# Patient Record
Sex: Female | Born: 1970 | Race: White | Hispanic: No | Marital: Married | Smoking: Current every day smoker
Health system: Southern US, Community
[De-identification: ages and names within clinical notes are randomized; demographics above are authoritative.]

## PROBLEM LIST (undated history)

## (undated) HISTORY — PX: TUBAL LIGATION: SHX77

---

## 1998-03-22 ENCOUNTER — Other Ambulatory Visit: Admission: RE | Admit: 1998-03-22 | Discharge: 1998-03-22 | Payer: Self-pay | Admitting: Obstetrics and Gynecology

## 1999-03-23 ENCOUNTER — Other Ambulatory Visit: Admission: RE | Admit: 1999-03-23 | Discharge: 1999-03-23 | Payer: Self-pay | Admitting: Obstetrics and Gynecology

## 2000-03-15 ENCOUNTER — Other Ambulatory Visit: Admission: RE | Admit: 2000-03-15 | Discharge: 2000-03-15 | Payer: Self-pay | Admitting: Obstetrics and Gynecology

## 2001-03-21 ENCOUNTER — Other Ambulatory Visit: Admission: RE | Admit: 2001-03-21 | Discharge: 2001-03-21 | Payer: Self-pay | Admitting: *Deleted

## 2002-08-04 ENCOUNTER — Other Ambulatory Visit: Admission: RE | Admit: 2002-08-04 | Discharge: 2002-08-04 | Payer: Self-pay | Admitting: Obstetrics and Gynecology

## 2003-01-28 ENCOUNTER — Inpatient Hospital Stay (HOSPITAL_COMMUNITY): Admission: AD | Admit: 2003-01-28 | Discharge: 2003-01-28 | Payer: Self-pay | Admitting: Obstetrics and Gynecology

## 2003-01-31 ENCOUNTER — Encounter (INDEPENDENT_AMBULATORY_CARE_PROVIDER_SITE_OTHER): Payer: Self-pay | Admitting: Specialist

## 2003-01-31 ENCOUNTER — Inpatient Hospital Stay (HOSPITAL_COMMUNITY): Admission: AD | Admit: 2003-01-31 | Discharge: 2003-02-03 | Payer: Self-pay | Admitting: Obstetrics and Gynecology

## 2003-03-16 ENCOUNTER — Other Ambulatory Visit: Admission: RE | Admit: 2003-03-16 | Discharge: 2003-03-16 | Payer: Self-pay | Admitting: Obstetrics and Gynecology

## 2004-04-07 ENCOUNTER — Other Ambulatory Visit: Admission: RE | Admit: 2004-04-07 | Discharge: 2004-04-07 | Payer: Self-pay | Admitting: Obstetrics and Gynecology

## 2005-05-25 ENCOUNTER — Other Ambulatory Visit: Admission: RE | Admit: 2005-05-25 | Discharge: 2005-05-25 | Payer: Self-pay | Admitting: Obstetrics and Gynecology

## 2006-09-18 ENCOUNTER — Ambulatory Visit (HOSPITAL_COMMUNITY): Admission: RE | Admit: 2006-09-18 | Discharge: 2006-09-18 | Payer: Self-pay | Admitting: Obstetrics and Gynecology

## 2006-11-15 ENCOUNTER — Observation Stay (HOSPITAL_COMMUNITY): Admission: AD | Admit: 2006-11-15 | Discharge: 2006-11-16 | Payer: Self-pay | Admitting: Obstetrics and Gynecology

## 2007-06-08 ENCOUNTER — Inpatient Hospital Stay (HOSPITAL_COMMUNITY): Admission: AD | Admit: 2007-06-08 | Discharge: 2007-06-11 | Payer: Self-pay | Admitting: Obstetrics and Gynecology

## 2007-06-08 ENCOUNTER — Encounter (INDEPENDENT_AMBULATORY_CARE_PROVIDER_SITE_OTHER): Payer: Self-pay | Admitting: Obstetrics and Gynecology

## 2007-06-19 ENCOUNTER — Inpatient Hospital Stay (HOSPITAL_COMMUNITY): Admission: AD | Admit: 2007-06-19 | Discharge: 2007-06-19 | Payer: Self-pay | Admitting: Obstetrics and Gynecology

## 2009-06-06 ENCOUNTER — Emergency Department (HOSPITAL_COMMUNITY): Admission: EM | Admit: 2009-06-06 | Discharge: 2009-06-06 | Payer: Self-pay | Admitting: Emergency Medicine

## 2010-04-25 LAB — POCT RAPID STREP A (OFFICE): Streptococcus, Group A Screen (Direct): NEGATIVE

## 2010-06-20 NOTE — Op Note (Signed)
[name], [name]              ACCOUNT NO.:  [id number]   MEDICAL RECORD NO.:  [id number]          PATIENT TYPE:  INP   LOCATION:  9374                          FACILITY:  [facility]   PHYSICIAN:  Guy Sandifer. Henderson Cloud, M.D. DATE OF BIRTH:  02-01-1971   DATE OF PROCEDURE:  06/08/2007  DATE OF DISCHARGE:                               OPERATIVE REPORT   PREOPERATIVE DIAGNOSES:  1. Intrauterine pregnancy at 36-2/7th weeks' estimated gestational      age.  2. Preterm labor.  3. Possible placental abruption.  4. Elevated blood pressure.   POSTOPERATIVE DIAGNOSES:  1. A 36-2/7th weeks' estimated gestational age.  2. Preterm labor.  3. Placental abruption.  4. Elevated blood pressure.   PROCEDURES:  1. Low transverse cesarean section.  2. Bilateral tubal ligation.   SURGEON:  Guy Sandifer. Henderson Cloud, MD   ANESTHESIA:  Spinal by Cline Crock, MD.   SPECIMENS:  Placenta and bilateral fallopian tube segments all to  pathology.   ESTIMATED BLOOD LOSS:  800 mL.   FINDINGS:  Viable female infant, Apgars of 8 and 9 at 1 and 5 minutes  respectively.  Birth weight 4 pounds 11 ounces.  Arterial cord pH 7.28.   INDICATIONS AND CONSENT:  This patient is a 40 year old married white  female G2, P1 with an EDC of Jul 04, 2007.  Prenatal care has been  complicated by history of cesarean section.  The patient desires repeat  cesarean section.  She had a placental abruption with her first  pregnancy as well.  She desires tubal ligation.  Blood pressure in the  office last week was noted to be somewhat elevated.  Her blood pressure  at home was approximately 130/80.  The patient awoke this morning with a  wet sensation and noted that she had bleeding down her leg.  She was not  feeling much in the way of uterine contractions or abdominal pain.  Evaluation in triage revealed stable and reactive fetal heart tones.  She was contracting every 3-4 minutes.  Uterus was soft between  contractions.  Cervix was 3 cm  dilated, 70% effaced, -2 station, and  vertex, which is a change from her last check.  Blood pressure was  approximately 150/104.  She had no CNS symptoms or epigastric pain.  Delivery was recommended.  The patient desires repeat C-section.  Potential risks and complications were discussed with the patient  preoperatively including but not limited to infection, organ damage,  bleeding, requiring transfusion of blood products with HIV and hepatitis  acquisition, DVT, PE, and pneumonia.  The patient also requests tubal  ligation.  Issues of prematurity with the baby, permanence of the  procedure, increased ectopic risk, and failure rate are reviewed as  well.  All questions are answered and consent is signed on the chart.   DESCRIPTION OF PROCEDURE:  The patient is taken to the operating room  where she is identified, spinal anesthetic is placed and she is placed  in a dorsal supine position with 15 degrees left lateral wedge.  She is  then prepped.  Foley catheter is placed and the  bladder is drained, and  she is draped in sterile fashion.  After testing for adequate spinal  anesthesia, skin is entered through a Pfannenstiel incision through the  previous scar.  Dissection is carried out in layers to the peritoneum  which is incised, extended superiorly and inferiorly.  Vesicouterine  peritoneum is taken down cephalad laterally.  Bladder flap is developed  and the bladder blade is placed.  Uterus is incised in a low-transverse  manner and the uterine cavity is entered bluntly with a hemostat.  The  uterine incision is extended cephalad laterally with fingers.  Artificial rupture of membranes for clear fluid is carried out.  Vertex  is delivered.  The oro-nasopharynx are suctioned.  There is a loose cord  around the shoulders.  Baby is then completely delivered and good cry  and tone is noted.  Cord is clamped and cut and the baby is handed to  awaiting pediatrics team.  Placenta is  manually delivered.  There is a  clot adherent to a portion of the placenta as well as older clot in the  uterine cavity consistent with partial abruption.  Placenta sent to  pathology.  The uterine cavity is clean.  Uterus is closed in two  running locking imbricating layers of 0 Monocryl suture, which achieves  good hemostasis.  Left and right ovaries are normal.  Left fallopian  tubes identified from cornu to fimbria.  It is grasped in its mid  ampullary portion with a Babcock clamp.  A knuckle of tube is then  doubly ligated with two free ties of 0 plain suture.  The intervening  knuckle of tube is then sharply resected.  Cautery is used to assure  complete hemostasis.  Similar procedure is carried out on the right  tube.  Anterior peritoneum was closed in running fashion with 0 Monocryl  suture, which is also used to reapproximate the pyramidalis muscle in  the midline.  Anterior rectus fascia is closed in running fashion with 0  PDS suture and the skin is closed with clips.  All sponge, instrument,  and needle counts are correct and the patient is transferred to recovery  room in stable condition.  It should be noted that prior to tubal  ligation, Dr. Alison Murray stated that baby was doing well and discussion with  the patient and father of the baby were carried out and she still  requested tubal ligation.      Guy Sandifer Henderson Cloud, M.D.  Electronically Signed     JET/MEDQ  D:  06/08/2007  T:  06/08/2007  Job:  [id number]

## 2010-06-23 NOTE — Op Note (Signed)
NAME:  SERIN, THORNELL                    ACCOUNT NO.:  [id number]   MEDICAL RECORD NO.:  [id number]                   PATIENT TYPE:  INP   LOCATION:  9119                                 FACILITY:  [facility]   PHYSICIAN:  Juluis Mire, M.D.                DATE OF BIRTH:  1970-03-29   DATE OF PROCEDURE:  01/31/2003  DATE OF DISCHARGE:                                 OPERATIVE REPORT   PREOPERATIVE DIAGNOSIS:  Intrauterine pregnancy at 36 weeks with probable  placental abruption.   POSTOPERATIVE DIAGNOSIS:  Intrauterine pregnancy at 36 weeks with probable  placental abruption, with evidence of a marginal abruption.   OPERATIVE PROCEDURE:  Low transverse cesarean section.   SURGEON:  Juluis Mire, M.D.   ANESTHESIA:  Spinal.   ESTIMATED BLOOD LOSS:  400 mL.   PACKS AND DRAINS:  None.   INTRAOPERATIVE BLOOD REPLACED:  None.   COMPLICATIONS:  None.   INDICATIONS:  The patient is a 40 year old primigravida married white female  at 50 weeks.  Presents with onset of vaginal bleeding.  In triage uterus was  felt to be somewhat tense, mildly tender on the right side.  Ultrasound  confirmed a possible retroplacental clot.  Her platelet count was normal.  Fetal heart rate was reactive without decelerations.  It was noted that she  was having moderate bleeding.  Cervix was 2 cm, 50% effaced.  Due to the  concern about abruption, we decided to proceed with emergent cesarean  section.  We felt the fetal heart rate was stable enough for a quick spinal.  Risks of surgery were discussed, including the risks of infection, risk of  hemorrhage that could necessitate transfusion, the risk of AIDS or  hepatitis, the risk of injury to adjacent organs requiring further  exploratory surgery, risk of deep venous thrombosis and pulmonary embolus.   The procedure was as follows:  The patient was taken to the OR, placed in  the supine position with left lateral tilt.  After a satisfactory level  of  spinal anesthesia was obtained, the abdomen was prepped out with Betadine  and draped as a sterile field.  A low transverse skin incision made with a  knife and carried through subcutaneous tissue.  The fascia was entered  sharply and the incision in the fascia extended laterally.  The fascia taken  off the muscle superiorly and inferiorly.  Rectus muscles were separated in  the midline.  The peritoneum was entered sharply and the incision in the  peritoneum extended both superiorly and inferiorly.  A low transverse  bladder flap was developed and a low transverse uterine incision begun with  a knife and extended laterally using manual traction.  Amniotic fluid was  clear.  The infant was in the vertex presentation, was delivered with  elevation of the head and fundal pressure.  The infant was a viable female  weighing 5 pounds 13 ounces, Apgars were 8/9.  Umbilical artery pH was 7.27.  A retroplacental clot was noted along the lower edge.  There was  approximately 100 mL of clot.  The placenta was removed manually and sent  for pathologic review.  The uterus was wiped free of remaining products of  conception.  Some old clotted material was noted, confirming the diagnosis  of abruption.  The uterus was exteriorized for closure.  Tubes and ovaries  were unremarkable.  Uterus closed with interrupted suture of 0 chromic using  a two-layered closure technique.  Hemostasis was excellent.  The uterus was  returned to the abdominal cavity.  The pelvic cavity was irrigated.  Hemostasis was good.  Urine output clear and adequate.  The muscles were  reapproximated with a running suture of 3-0 Vicryl, the fascia closed with a  running suture of 0 PDS.  Skin was closed with staples and Steri-Strips.  Sponge, instrument, and needle count reported as correct by the circulating  nurse x2.  The Foley catheter remained clear at the time of closure.  The  patient tolerated the procedure well, was returned  to the recovery room in  good condition.                                               Juluis Mire, M.D.    JSM/MEDQ  D:  01/31/2003  T:  01/31/2003  Job:  [id number]

## 2010-06-23 NOTE — Discharge Summary (Signed)
NAMEMarland Kitchen  [name], [name]                    ACCOUNT NO.:  [id number]   MEDICAL RECORD NO.:  [id number]                   PATIENT TYPE:  INP   LOCATION:  9119                                 FACILITY:  [facility]   PHYSICIAN:  Freddy Finner, M.D.                DATE OF BIRTH:  04/23/1970   DATE OF ADMISSION:  01/31/2003  DATE OF DISCHARGE:  02/03/2003                                 DISCHARGE SUMMARY   ADMITTING DIAGNOSES:  1. Intrauterine pregnancy at 53 weeks estimated gestational age.  2. Vaginal bleeding, probable placental abruption.   DISCHARGE DIAGNOSES:  1. Status post low transverse cesarean section secondary to marginal     placental abruption.  2. Viable female infant.   PROCEDURE:  Primary low transverse cesarean section.   REASON FOR ADMISSION:  Please see written H&P.   HOSPITAL COURSE:  The patient was a 40 year old white married female  primigravida that presented to [facility] at 36 weeks  estimated gestational age with the onset of vaginal bleeding.  On admission  the uterus was felt to be somewhat tense and mildly tender on the right  side.  Ultrasound was performed which revealed a possible retroplacental  clot.  Her platelet count was normal.  Fetal heart tones were reactive  without deceleration.  It was noted that she was having moderate vaginal  bleeding and cervix was noted to be 2 cm dilated, 50% effaced.  Due to the  concern about placental abruption decision was made to proceed with an  emergent cesarean delivery.  The patient was then transferred to the  operating room where a spinal anesthesia was administered without  difficulty.  A low transverse incision was made with the delivery of a  viable female infant weighing 5 pounds 13 ounces, Apgars of 8 at one minute  and 9 at five minutes.  Umbilical cord pH was 7.27.  The patient tolerated  the procedure well and was taken to the recovery room in stable condition.  On postoperative day #1  vital signs were stable, she was afebrile, blood  pressure 118/78.  Abdomen was soft with good return of bowel function and  fundus was firm and nontender.  Hemoglobin was noted to be 8.1.  The patient  was started on some iron supplementation.  On postoperative day #2 vital  signs were stable.  Abdomen soft, fundus firm and nontender.  Hemoglobin was  stable at 8.2.  On postoperative day #3 vital signs were stable; she was  afebrile.  Abdomen was soft, fundus was firm and nontender.  Incision was  clean, dry, and intact.  Staples were removed and the patient was discharged  home.   CONDITION ON DISCHARGE:  Good.   DIET:  Regular as tolerated.   ACTIVITY:  No heavy lifting, no driving x2 weeks, no vaginal entry.   FOLLOW-UP:  The patient is to follow up in the office in 1-2 weeks for an  incision check.  She is to call for temperature greater than 100 degrees,  persistent nausea and vomiting, heavy vaginal bleeding, and/or redness or  drainage from the incisional site.   DISCHARGE MEDICATIONS:  1. Percocet 5/325 #30 one p.o. q.4-6h. p.r.n. pain.  2. Motrin 600 mg q.6h. p.r.n.  3. Niferex 150 mg.  4. Prenatal vitamins one p.o. daily.  5. Colace one p.o. daily p.r.n.     Julio Sicks, N.P.                        Freddy Finner, M.D.    CC/MEDQ  D:  03/02/2003  T:  03/02/2003  Job:  [id number]

## 2010-06-23 NOTE — H&P (Signed)
NAMEDARYLE, BOYINGTON          ACCOUNT NO.:  [id number]   MEDICAL RECORD NO.:  [id number]          PATIENT TYPE:  amb   LOCATION:                                [facility]   PHYSICIAN:  Guy Sandifer. Henderson Cloud, M.D.      DATE OF BIRTH:   DATE OF ADMISSION:  09/18/2006  DATE OF DISCHARGE:                              HISTORY & PHYSICAL   CHIEF COMPLAINT:  Vaginal cyst.   HISTORY OF PRESENT ILLNESS:  This patient is a 40 year old, divorced,  white female, G1, P1 with an enlarging vaginal cyst which has been at  times uncomfortable with intercourse. Ultrasound in my office on 09/12/06,  reveals a 3.9-cm simple cyst of the upper vagina. The right ovary  contains a 1.9-cm follicle. The patient is being admitted for incision  and drainage. The potential risks and complications have been discussed  preoperatively.   PAST MEDICAL HISTORY:  Negative.   PAST SURGICAL HISTORY:  Negative.   FAMILY HISTORY:  Hepatitis C in mother and asthma in mother.   OBSTETRIC HISTORY:  Status post Cesarean section.   SOCIAL HISTORY:  Smokes half pack of cigarettes a day, occasional  alcohol consumption. Denies drug abuse.   MEDICATIONS:  Vitamins.   ALLERGIES:  ERYTHROMYCIN.   REVIEW OF SYSTEMS:  NEUROLOGICAL: Denies headache. CARDIAC: Denies chest  pain. PULMONARY: Denies shortness of breath.   PHYSICAL EXAMINATION:  VITAL SIGNS: Height 5 feet 4 inches, weight 132  pounds, blood pressure 100/60.  HEENT: Without thyromegaly.  LUNGS: Clear to auscultation.  HEART: Regular rate and rhythm.  BACK: Without CVA tenderness.  BREASTS: Without mass or active discharge.  ABDOMEN: The abdomen is soft, nontender without masses.  PELVIC: On exam vulva without lesion. There is a 4-cm cyst arising  primarily in the right vaginal fornix which essentially obscures the  cervix. Uterus is nontender. Adnexa nontender without masses.  EXTREMITIES: Grossly within normal limits.  NEUROLOGICAL: Exam grossly within  normal limits.   ASSESSMENT:  Enlarging vaginal cyst.   PLAN:  Incision and drainage.      Guy Sandifer Henderson Cloud, M.D.  Electronically Signed     JET/MEDQ  D:  09/12/2006  T:  09/12/2006  Job:  [id number]

## 2010-06-23 NOTE — Op Note (Signed)
[name], [name]          ACCOUNT NO.:  [id number]   MEDICAL RECORD NO.:  [id number]          PATIENT TYPE:  AMB   LOCATION:  SDC                           [facility]   PHYSICIAN:  Guy Sandifer. Henderson Cloud, M.D. DATE OF BIRTH:  July 10, 1970   DATE OF PROCEDURE:  09/18/2006  DATE OF DISCHARGE:                               OPERATIVE REPORT   PREOPERATIVE DIAGNOSIS:  Vaginal cyst.   POSTOPERATIVE DIAGNOSIS:  Vaginal cyst.   PROCEDURE:  Marsupialization vaginal cyst.   SURGEON:  Guy Sandifer. Henderson Cloud, M.D.   ANESTHESIA:  General with LMA.   ESTIMATED BLOOD LOSS:  Drops   INDICATIONS:  This patient is a 40 year old divorced white female G1, P1  with enlarging vaginal cyst.  Details dictated history and physical.  Removal of the cyst has been discussed preoperatively.  Potential risks  and complications have been discussed preoperatively including but not  limited to infection, organ damage, bleeding with hepatitis and HIV  acquisition, postoperative pain and dyspareunia and recurrence of the  cyst.  All questions were answered and consent signed on the chart.   FINDINGS:  There is a 4-5 cm smooth fluctuant cystic structure in the 2  o'clock position of the vaginal fornix.   PROCEDURE:  The patient is taken to operating room where she is  identified, placed in dorsosupine position and general anesthesia is  induced via LMA.  She is then placed in dorsal lithotomy position where  she is prepped and draped in sterile fashion.  Weighted speculum was  placed and the anterior vagina is retracted.  Then a 2-3 cm portion of  the cyst is injected submucosally with 0.5% lidocaine with 1:100,000  epinephrine.  This is incised for approximately 2 cm.  The cyst is  entered and clear fluid evacuates.  The interior of cyst is smooth.  The  opening that is being created is marsupialized with a 2-0 vaginal  suture.  Good hemostasis is noted.  Procedure was terminated.  All  counts are correct.   The patient awakened, taken to recovery room in  stable condition.      Guy Sandifer Henderson Cloud, M.D.  Electronically Signed     JET/MEDQ  D:  09/18/2006  T:  09/18/2006  Job:  [id number]

## 2010-06-23 NOTE — Discharge Summary (Signed)
[name], [name]              ACCOUNT NO.:  [id number]   MEDICAL RECORD NO.:  [id number]          PATIENT TYPE:  INP   LOCATION:  9144                          [facility]   PHYSICIAN:  Duke Salvia. Marcelle Overlie, M.D.DATE OF BIRTH:  August 16, 1970   DATE OF ADMISSION:  06/08/2007  DATE OF DISCHARGE:  06/11/2007                               DISCHARGE SUMMARY   ADMITTING DIAGNOSES:  1. Intrauterine pregnancy at 36-2/7 weeks' estimated gestational age.  2. Previous cesarean section.  3. Preterm labor with questionable placental abruption.  4. Elevation in blood pressure.  5. Multiparity, desires permanent sterilization.   DISCHARGE DIAGNOSES:  1. Status post low transverse cesarean section.  2. Viable female infant.  3. Permanent sterilization.  4. Partial placental abruption.   PROCEDURE:  1. Repeat low transverse cesarean section.  2. Bilateral tubal ligation.   REASON FOR ADMISSION:  Please see written H&P.   HOSPITAL COURSE:  The patient is 40 year old white married female  gravida 2, para 1 that was admitted to [facility] with  complaints of uterine contractions and vaginal bleeding.  On admission,  vital signs were stable with blood pressure noted to be slightly  elevated at 130/80.  Contractions were noted to be approximately every 3-  4 minutes.  The patient had had a previous cesarean section and did  desire repeat, and due to multiparity, the patient also requested  permanent sterilization.  Cervix was examined and found to be 3 cm  dilated, 70% effaced vertex, at a -2 station.  The patient denied any  CNS symptoms or epigastric pain.  Decision was made to proceed with a  cesarean delivery.  The patient was then taken to the operating room  where spinal anesthesia was administered without difficulty.  A low-  transverse incision was made with delivery of a viable female infant  weighing 4 pounds 11 ounces with Apgars of 8 at 1 minute and 9 at 5  minutes.   Arterial cord pH of 7.28.  The patient also underwent tubal  ligation without difficulty.  Placenta at the time of the delivery was  noted to have clot consistent with a partial placental abruption.  The  patient was transferred to the AICU in stable condition.  The patient  was started on magnesium sulfate and remained there for approximately 24  hours where she was observed closely.  On postoperative day #1, she  denied headache, blurred vision, or right upper quadrant pain.  Vital  signs were stable.  Blood pressure 140/90.  Abdomen soft.  Fundus firm  and nontender.  Abdominal dressing was noted to be clean, dry, and  intact.  Deep tendon reflexes were 2+, no clonus.  Laboratory findings  showed hemoglobin of 11.1 and platelet count 150,000.  Later that  evening, magnesium sulfate was discontinued and the patient was  transferred to the mother baby unit.  On postoperative day #2, the  patient denied headache, blurred vision, or right upper quadrant pain.  Vital signs were stable.  She was afebrile.  Blood pressure was 125/78  to 128/84.  Deep tendon reflexes were  1+, no clonus.  No pedal edema was  observed.  Abdomen soft.  Fundus firm and nontender.  Incision was  clean, dry and intact.  On postoperative day #3, the patient was without  complaint.  Vital signs were stable.  She was afebrile.  Abdomen soft.  Fundus firm and nontender.  Incision was clean, dry, and intact.  Staples removed and the patient was later discharged home.   CONDITION ON DISCHARGE:  Stable.   DIET:  Regular as tolerated.   ACTIVITY:  No heavy lifting, no driving x2 weeks, no vaginal entry.   FOLLOW-UP:  Patient is to follow up in the office in one week for an  incision check and a blood pressure check.  She is to call for  temperature greater than 100 degrees, persistent nausea, vomiting, heavy  vaginal bleeding, and/or redness or drainage from incisional site.  The  patient was also instructed to call for  headache, blurred vision, or  right upper quadrant pain.   DISCHARGE MEDICATIONS:  1. Tylox #31 p.o. every 4-6 hours p.r.n.  2. Motrin 600 mg every 6 hours.  3. Prenatal vitamins one p.o. daily.  4. Colace one p.o. daily.  Taking      Julio Sicks, N.P.      Richard M. Marcelle Overlie, M.D.  Electronically Signed    CC/MEDQ  D:  06/25/2007  T:  06/25/2007  Job:  [id number]

## 2010-11-16 LAB — CBC
HCT: 28.1 — ABNORMAL LOW
HCT: 31.7 — ABNORMAL LOW
Hemoglobin: 10 — ABNORMAL LOW
Hemoglobin: 11.3 — ABNORMAL LOW
MCHC: 35.5
MCHC: 35.7
MCV: 88.6
MCV: 89.5
Platelets: 198
Platelets: 228
RBC: 3.18 — ABNORMAL LOW
RBC: 3.54 — ABNORMAL LOW
RDW: 12.3
RDW: 12.4
WBC: 10.3
WBC: 7.8

## 2010-11-16 LAB — URINALYSIS, ROUTINE W REFLEX MICROSCOPIC
Bilirubin Urine: NEGATIVE
Glucose, UA: NEGATIVE
Hgb urine dipstick: NEGATIVE
Ketones, ur: NEGATIVE
Nitrite: NEGATIVE
Protein, ur: NEGATIVE
Specific Gravity, Urine: <1.005 — ABNORMAL LOW
Urobilinogen, UA: 0.2
pH: 6.5

## 2010-11-16 LAB — DIFFERENTIAL
Basophils Absolute: 0
Basophils Absolute: 0
Basophils Relative: 0
Basophils Relative: 0
Eosinophils Absolute: 0.1
Eosinophils Absolute: 0.1
Eosinophils Relative: 1
Eosinophils Relative: 2
Lymphocytes Relative: 17
Lymphocytes Relative: 26
Lymphs Abs: 1.7
Lymphs Abs: 2
Monocytes Absolute: 0.6
Monocytes Absolute: 0.6
Monocytes Relative: 6
Monocytes Relative: 7
Neutro Abs: 5
Neutro Abs: 7.9 — ABNORMAL HIGH
Neutrophils Relative %: 65
Neutrophils Relative %: 77

## 2010-11-16 LAB — COMPREHENSIVE METABOLIC PANEL
ALT: 14
AST: 13
Albumin: 3.5
Alkaline Phosphatase: 39
BUN: 10
CO2: 27
Calcium: 9.4
Chloride: 105
Creatinine, Ser: 0.43
GFR calc Af Amer: >60
GFR calc non Af Amer: >60
Glucose, Bld: 108 — ABNORMAL HIGH
Potassium: 3.5
Sodium: 136
Total Bilirubin: 0.7
Total Protein: 6.2

## 2010-11-16 LAB — AMYLASE: Amylase: 80

## 2010-11-16 LAB — LIPASE, BLOOD: Lipase: 16

## 2010-11-20 LAB — CBC
HCT: 35.7 — ABNORMAL LOW
Hemoglobin: 12.2
MCHC: 34.2
MCV: 89.7
Platelets: 237
RBC: 3.98
RDW: 12.9
WBC: 8.4

## 2010-11-20 LAB — HCG, SERUM, QUALITATIVE: Preg, Serum: NEGATIVE

## 2011-04-30 ENCOUNTER — Emergency Department (INDEPENDENT_AMBULATORY_CARE_PROVIDER_SITE_OTHER)
Admission: EM | Admit: 2011-04-30 | Discharge: 2011-04-30 | Disposition: A | Discharge status: Home / Self Care | Payer: BC Managed Care – PPO | Source: Home / Self Care | Attending: Emergency Medicine | Admitting: Emergency Medicine

## 2011-04-30 ENCOUNTER — Encounter (HOSPITAL_COMMUNITY): Payer: Self-pay

## 2011-04-30 DIAGNOSIS — J069 Acute upper respiratory infection, unspecified: Secondary | ICD-10-CM

## 2011-04-30 MED ORDER — CETIRIZINE-PSEUDOEPHEDRINE ER 5-120 MG PO TB12: 1.0000 | ORAL_TABLET | Freq: Every day | ORAL | Status: AC

## 2011-04-30 MED ORDER — GUAIFENESIN-CODEINE 100-10 MG/5ML PO SYRP: 5.0000 mL | ORAL_SOLUTION | Freq: Three times a day (TID) | ORAL | Status: AC | PRN

## 2011-04-30 NOTE — ED Provider Notes (Addendum)
History     CSN: [id number]  Arrival date & time 04/30/11  4098   First MD Initiated Contact with Patient 04/30/11 1100      Chief Complaint  Patient presents with  . Cough    (Consider location/radiation/quality/duration/timing/severity/associated sxs/prior treatment) HPI Comments: Patient presents urgent care complaining since Friday with respiratory symptoms such as cough which is barking in character and dry. body aches and fevers for the last 2 days. Son has become sick during the weekend after her been trying some over-the-counter decongestants with no relief. Patient denies any shortness of breath, no nausea no vomiting.  Patient is a 41 y.o. female presenting with cough. The history is provided by the patient.  Cough This is a new problem. The current episode started more than 2 days ago. The problem occurs constantly. The problem has not changed since onset.The cough is non-productive. The maximum temperature recorded prior to her arrival was 100 to 100.9 F. Associated symptoms include chills, ear congestion, rhinorrhea and myalgias. Pertinent negatives include no shortness of breath and no wheezing. She is not a smoker. Her past medical history does not include COPD or asthma.    History reviewed. No pertinent past medical history.  Past Surgical History  Procedure Date  . Cesarean section   . Tubal ligation     History reviewed. No pertinent family history.  History  Substance Use Topics  . Smoking status: Current Everyday Smoker  . Smokeless tobacco: Not on file  . Alcohol Use: Yes    OB History    Grav Para Term Preterm Abortions TAB SAB Ect Mult Living                  Review of Systems  Constitutional: Positive for fever, chills and appetite change.  HENT: Positive for congestion and rhinorrhea.   Respiratory: Positive for cough. Negative for shortness of breath and wheezing.   Musculoskeletal: Positive for myalgias.  Skin: Negative for pallor.     Allergies  Erythromycin  Home Medications   Current Outpatient Rx  Name Route Sig Dispense Refill  . CETIRIZINE-PSEUDOEPHEDRINE ER 5-120 MG PO TB12 Oral Take 1 tablet by mouth daily. 14 tablet 0  . GUAIFENESIN-CODEINE 100-10 MG/5ML PO SYRP Oral Take 5 mLs by mouth 3 (three) times daily as needed for cough. 120 mL 0    BP 110/75  Pulse 94  Temp(Src) 98.2 F (36.8 C) (Oral)  Resp 16  SpO2 99%  LMP 04/23/2011  Physical Exam  Nursing note and vitals reviewed. Constitutional: She appears well-developed and well-nourished. No distress.  HENT:  Head: Normocephalic.  Right Ear: Tympanic membrane normal.  Left Ear: Tympanic membrane normal.  Nose: Rhinorrhea present.  Mouth/Throat: Uvula is midline, oropharynx is clear and moist and mucous membranes are normal. No oropharyngeal exudate.  Eyes: Conjunctivae are normal. No scleral icterus.  Neck: Neck supple. No JVD present.  Cardiovascular: Normal rate.   Pulmonary/Chest: Effort normal. No respiratory distress. She has no decreased breath sounds. She has no wheezes. She has no rhonchi. She has no rales. She exhibits no tenderness.  Abdominal: There is no tenderness.  Lymphadenopathy:    She has no cervical adenopathy.  Skin: No rash noted. No erythema.    ED Course  Procedures (including critical care time)  Labs Reviewed - No data to display No results found.   1. Upper respiratory infection       MDM  Visual of upper respiratory symptoms for 3 days with a household contact (  some) with similar symptoms. Patient in no respiratory distress. Symptomatic with management recommended patient agree with treatment plan and followup as necessary        Jimmie Molly, MD 04/30/11 1233  Jimmie Molly, MD 04/30/11 [phone]

## 2011-04-30 NOTE — Discharge Instructions (Signed)
As discussed your current symptoms and exam were consistent with a viral respiratory illness, have recommended symptomatic treatment this to medicines should help in controlling some of your symptoms along with aggressive hydration and humidifier use. Return for further symptoms or worsening or symptoms persist beyond 7 days specially fevers.

## 2011-04-30 NOTE — ED Notes (Addendum)
Patient c/o she has been having cough, body aches, fever for past few days; cough barky sounding, but non-productive; son ill w similar syx; ; using OTC's w/o relief; NAD at present

## 2014-02-05 HISTORY — PX: BREAST BIOPSY: SHX20

## 2014-03-05 ENCOUNTER — Other Ambulatory Visit: Payer: Self-pay | Admitting: Obstetrics and Gynecology

## 2014-03-08 LAB — CYTOLOGY - PAP

## 2014-03-10 ENCOUNTER — Other Ambulatory Visit: Payer: Self-pay | Admitting: Obstetrics and Gynecology

## 2014-03-10 DIAGNOSIS — R928 Other abnormal and inconclusive findings on diagnostic imaging of breast: Secondary | ICD-10-CM

## 2014-03-19 ENCOUNTER — Ambulatory Visit
Admission: RE | Admit: 2014-03-19 | Discharge: 2014-03-19 | Disposition: A | Discharge status: Home / Self Care | Payer: BLUE CROSS/BLUE SHIELD | Source: Ambulatory Visit | Attending: Obstetrics and Gynecology | Admitting: Obstetrics and Gynecology

## 2014-03-19 DIAGNOSIS — R928 Other abnormal and inconclusive findings on diagnostic imaging of breast: Secondary | ICD-10-CM

## 2014-08-30 ENCOUNTER — Other Ambulatory Visit: Payer: Self-pay | Admitting: Obstetrics and Gynecology

## 2014-08-30 DIAGNOSIS — N6310 Unspecified lump in the right breast, unspecified quadrant: Secondary | ICD-10-CM

## 2014-10-01 ENCOUNTER — Other Ambulatory Visit: Payer: Self-pay | Admitting: Obstetrics and Gynecology

## 2014-10-01 ENCOUNTER — Ambulatory Visit
Admission: RE | Admit: 2014-10-01 | Discharge: 2014-10-01 | Disposition: A | Discharge status: Home / Self Care | Payer: BLUE CROSS/BLUE SHIELD | Source: Ambulatory Visit | Attending: Obstetrics and Gynecology | Admitting: Obstetrics and Gynecology

## 2014-10-01 DIAGNOSIS — N6310 Unspecified lump in the right breast, unspecified quadrant: Secondary | ICD-10-CM

## 2014-10-15 ENCOUNTER — Ambulatory Visit
Admission: RE | Admit: 2014-10-15 | Discharge: 2014-10-15 | Disposition: A | Discharge status: Home / Self Care | Payer: BLUE CROSS/BLUE SHIELD | Source: Ambulatory Visit | Attending: Obstetrics and Gynecology | Admitting: Obstetrics and Gynecology

## 2014-10-15 ENCOUNTER — Other Ambulatory Visit: Payer: Self-pay | Admitting: Obstetrics and Gynecology

## 2014-10-15 DIAGNOSIS — N6310 Unspecified lump in the right breast, unspecified quadrant: Secondary | ICD-10-CM

## 2015-05-04 ENCOUNTER — Other Ambulatory Visit: Payer: Self-pay | Admitting: Obstetrics and Gynecology

## 2015-05-04 DIAGNOSIS — N6310 Unspecified lump in the right breast, unspecified quadrant: Secondary | ICD-10-CM

## 2015-05-27 ENCOUNTER — Other Ambulatory Visit: Payer: Self-pay | Admitting: Obstetrics and Gynecology

## 2015-05-27 ENCOUNTER — Ambulatory Visit
Admission: RE | Admit: 2015-05-27 | Discharge: 2015-05-27 | Disposition: A | Discharge status: Home / Self Care | Payer: BLUE CROSS/BLUE SHIELD | Source: Ambulatory Visit | Attending: Obstetrics and Gynecology | Admitting: Obstetrics and Gynecology

## 2015-05-27 DIAGNOSIS — N6310 Unspecified lump in the right breast, unspecified quadrant: Secondary | ICD-10-CM

## 2017-05-23 ENCOUNTER — Other Ambulatory Visit: Payer: Self-pay | Admitting: Obstetrics and Gynecology

## 2017-05-23 DIAGNOSIS — R928 Other abnormal and inconclusive findings on diagnostic imaging of breast: Secondary | ICD-10-CM

## 2017-05-29 ENCOUNTER — Other Ambulatory Visit: Payer: BLUE CROSS/BLUE SHIELD

## 2017-06-04 ENCOUNTER — Other Ambulatory Visit: Payer: Self-pay | Admitting: Obstetrics and Gynecology

## 2017-06-04 ENCOUNTER — Ambulatory Visit
Admission: RE | Admit: 2017-06-04 | Discharge: 2017-06-04 | Disposition: A | Discharge status: Home / Self Care | Payer: BLUE CROSS/BLUE SHIELD | Source: Ambulatory Visit | Attending: Obstetrics and Gynecology | Admitting: Obstetrics and Gynecology

## 2017-06-04 DIAGNOSIS — R928 Other abnormal and inconclusive findings on diagnostic imaging of breast: Secondary | ICD-10-CM

## 2017-06-04 DIAGNOSIS — N6310 Unspecified lump in the right breast, unspecified quadrant: Secondary | ICD-10-CM

## 2017-06-12 ENCOUNTER — Ambulatory Visit
Admission: RE | Admit: 2017-06-12 | Discharge: 2017-06-12 | Disposition: A | Discharge status: Home / Self Care | Payer: BLUE CROSS/BLUE SHIELD | Source: Ambulatory Visit | Attending: Obstetrics and Gynecology | Admitting: Obstetrics and Gynecology

## 2017-06-12 DIAGNOSIS — N6310 Unspecified lump in the right breast, unspecified quadrant: Secondary | ICD-10-CM

## 2018-07-08 ENCOUNTER — Other Ambulatory Visit: Payer: Self-pay | Admitting: Obstetrics and Gynecology

## 2018-07-08 DIAGNOSIS — N6011 Diffuse cystic mastopathy of right breast: Secondary | ICD-10-CM

## 2018-07-24 ENCOUNTER — Other Ambulatory Visit: Payer: Self-pay

## 2018-07-24 ENCOUNTER — Ambulatory Visit
Admission: RE | Admit: 2018-07-24 | Discharge: 2018-07-24 | Disposition: A | Discharge status: Home / Self Care | Payer: BLUE CROSS/BLUE SHIELD | Source: Ambulatory Visit | Attending: Obstetrics and Gynecology | Admitting: Obstetrics and Gynecology

## 2018-07-24 ENCOUNTER — Ambulatory Visit
Admission: RE | Admit: 2018-07-24 | Discharge: 2018-07-24 | Disposition: A | Discharge status: Home / Self Care | Payer: BC Managed Care – PPO | Source: Ambulatory Visit | Attending: Obstetrics and Gynecology | Admitting: Obstetrics and Gynecology

## 2018-07-24 DIAGNOSIS — N6011 Diffuse cystic mastopathy of right breast: Secondary | ICD-10-CM

## 2019-05-08 ENCOUNTER — Ambulatory Visit: Payer: BC Managed Care – PPO | Attending: Internal Medicine

## 2019-05-08 DIAGNOSIS — Z23 Encounter for immunization: Secondary | ICD-10-CM

## 2019-05-08 NOTE — Progress Notes (Signed)
   Covid-19 Vaccination Clinic  Name:  [name]    MRN: [id number] DOB: 1970-05-12  05/08/2019  [name] was observed post Covid-19 immunization for 15 minutes without incident. She was provided with Vaccine Information Sheet and instruction to access the V-Safe system.   [name] was instructed to call 911 with any severe reactions post vaccine: Marland Kitchen Difficulty breathing  . Swelling of face and throat  . A fast heartbeat  . A bad rash all over body  . Dizziness and weakness   Immunizations Administered    Name Date Dose VIS Date Route   Pfizer COVID-19 Vaccine 05/08/2019  1:14 PM 0.3 mL 01/16/2019 Intramuscular   Manufacturer: [employer], [employer]   Lot: [id number]   NDC: [address]-8

## 2019-06-03 ENCOUNTER — Ambulatory Visit: Payer: BC Managed Care – PPO | Attending: Internal Medicine

## 2019-06-03 DIAGNOSIS — Z23 Encounter for immunization: Secondary | ICD-10-CM

## 2019-06-03 NOTE — Progress Notes (Signed)
   Covid-19 Vaccination Clinic  Name:  KEELIA GRAYBILL    MRN: [id number] DOB: Jan 17, 1971  06/03/2019  Ms. Jiles was observed post Covid-19 immunization for 15 minutes without incident. She was provided with Vaccine Information Sheet and instruction to access the V-Safe system.   Ms. Otte was instructed to call 911 with any severe reactions post vaccine: Marland Kitchen Difficulty breathing  . Swelling of face and throat  . A fast heartbeat  . A bad rash all over body  . Dizziness and weakness   Immunizations Administered    Name Date Dose VIS Date Route   Pfizer COVID-19 Vaccine 06/03/2019  2:59 PM 0.3 mL 04/01/2018 Intramuscular   Manufacturer: [employer], [employer]   Lot: [id number]   NDC: [address]-9

## 2019-12-11 IMAGING — MG DIGITAL DIAGNOSTIC BILATERAL MAMMOGRAM WITH TOMO AND CAD
8 series · 8 of 24 positions shown · non-contrast
Comparison: 06/12/2017 and earlier

CLINICAL DATA: The patient had stereotactic guided core biopsy of
the RIGHT breast 06/12/2017 demonstrating fibroadenoma with
fibrocystic changes. Findings were felt to be concordant. It was
recommended that the patient return in 6 months to evaluate possible
second nodule anterior to the index lesion seen only on mammogram.

EXAM:
DIGITAL DIAGNOSTIC BILATERAL MAMMOGRAM WITH CAD AND TOMO
ULTRASOUND RIGHT BREAST

[R MLO synth-2D]
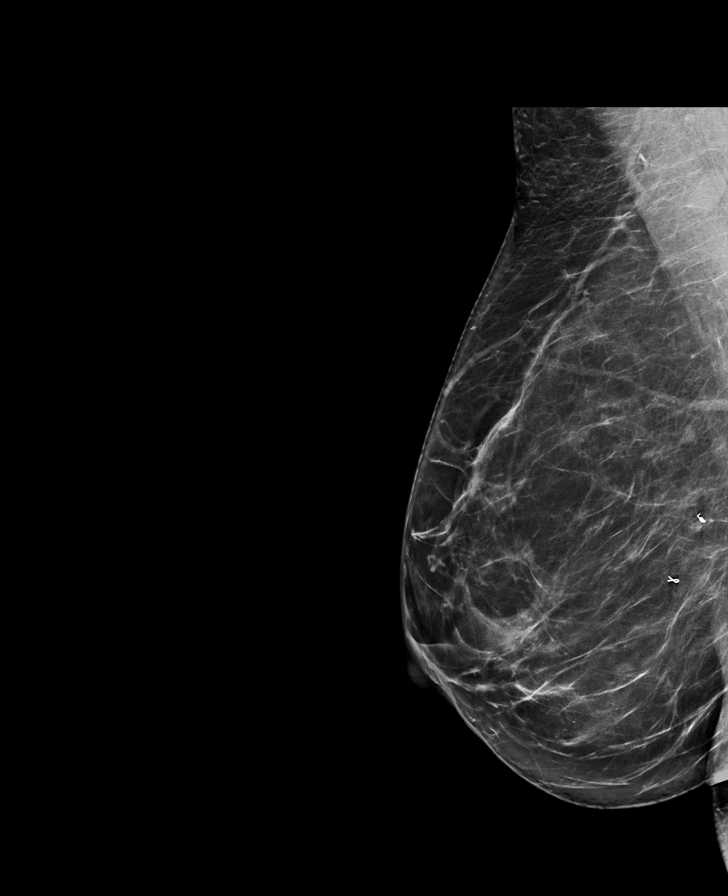

[R CC synth-2D]
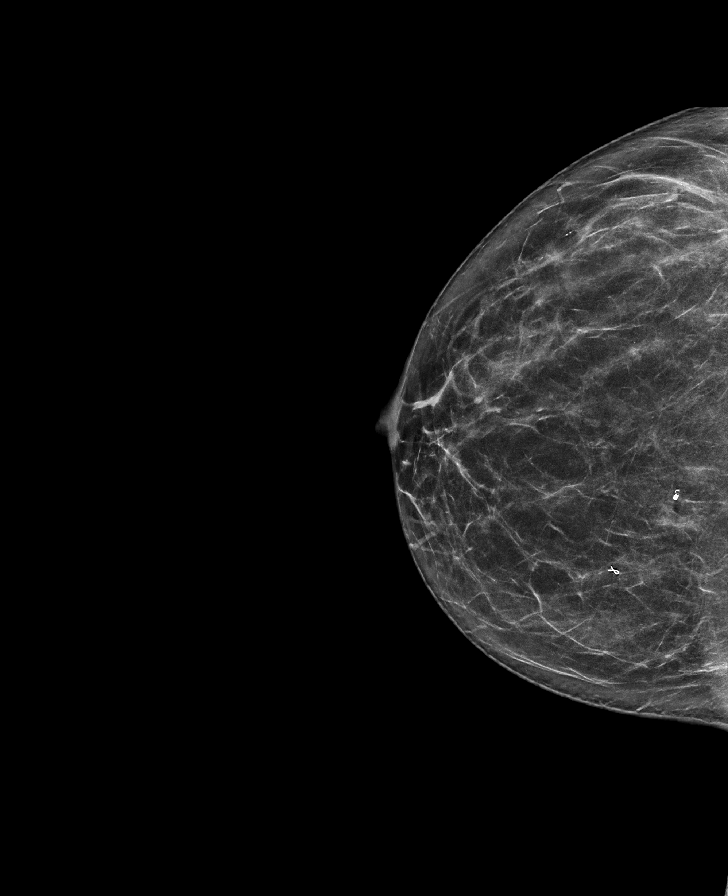

[L MLO synth-2D]
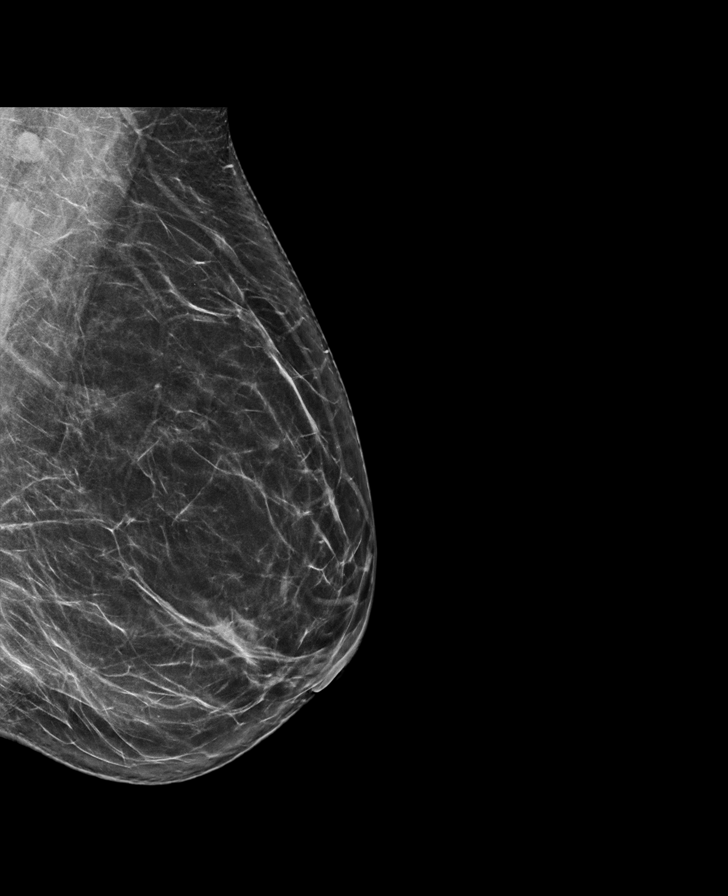

[L CC synth-2D]
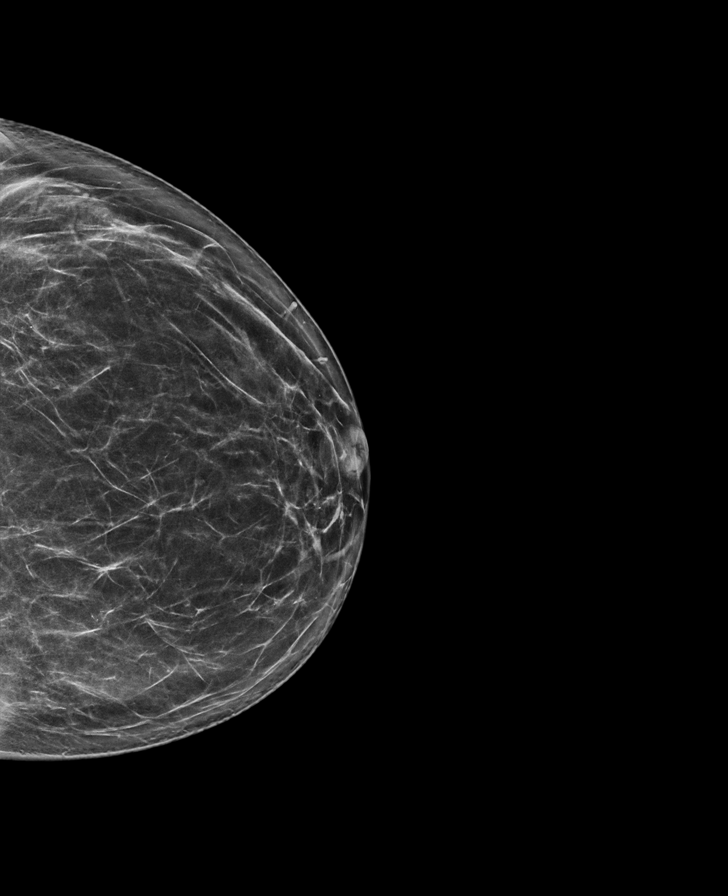

[L MLO tomo · tomo slice 39/78.0]
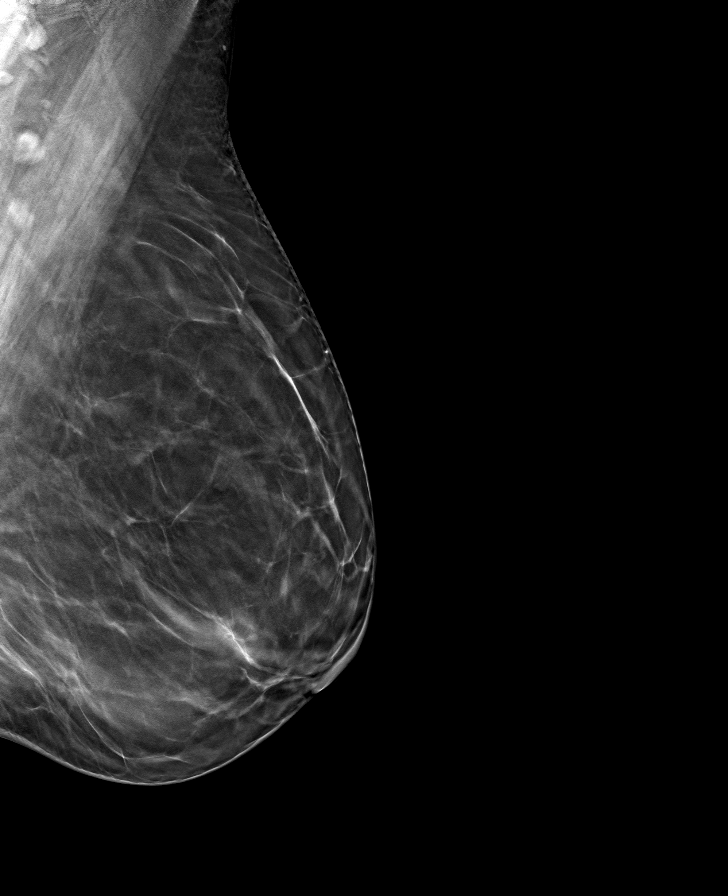

[R CC tomo · tomo slice 37/73.0]
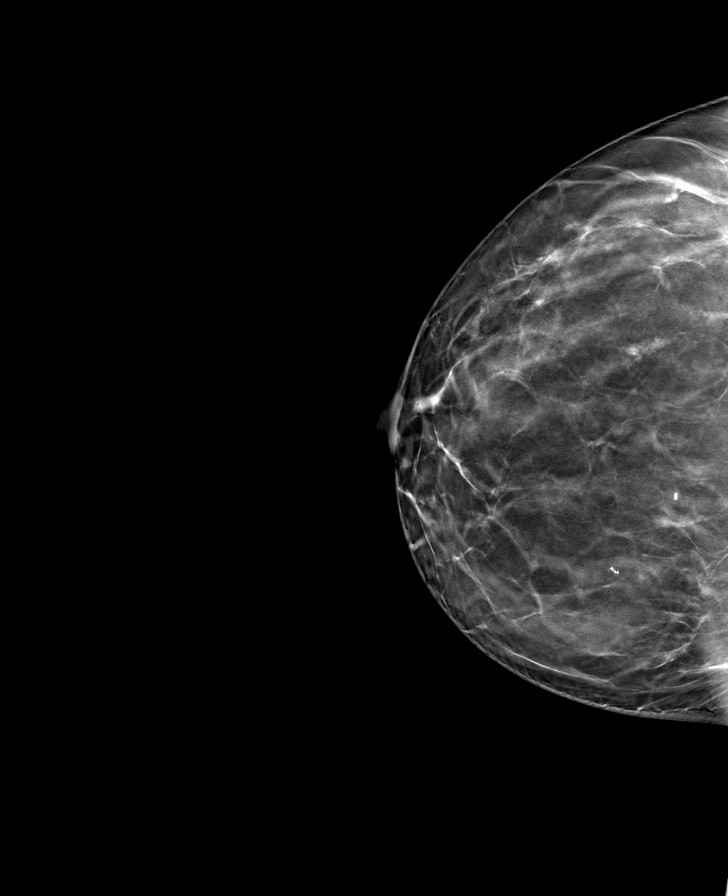

[L CC tomo · tomo slice 38/75.0]
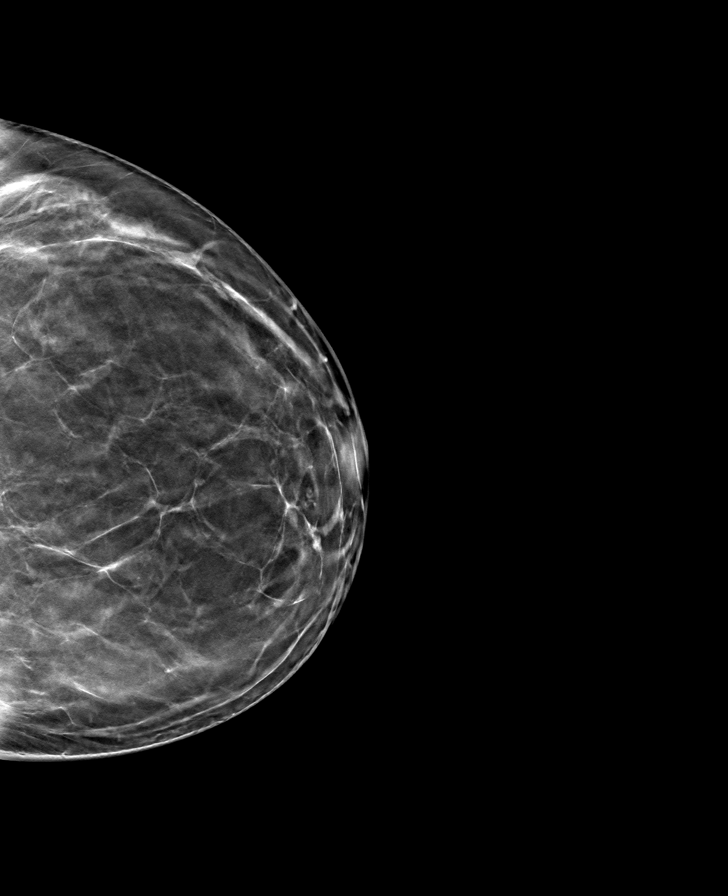

[R MLO tomo · tomo slice 42/83.0]
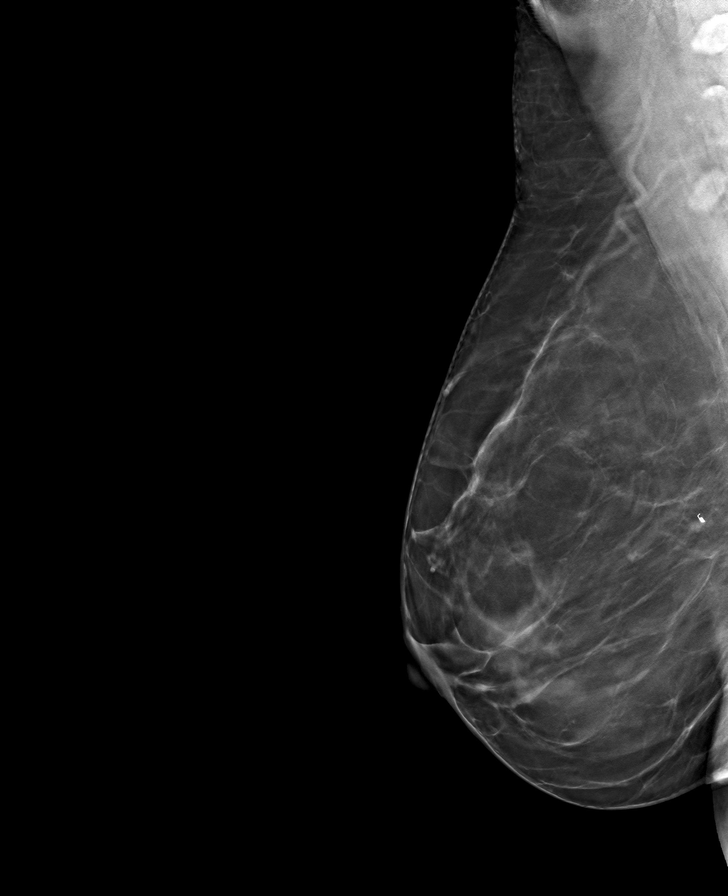

[8 of 24 positions shown; findings below may reference images not displayed]

ACR Breast Density Category b: There are scattered areas of
fibroglandular density.
FINDINGS: Ribbon shaped and coil shaped clips are identified in the MEDIAL
posterior portion of the RIGHT breast. No new mass or areas of
concern are identified in the RIGHT breast. LEFT breast is negative.

Mammographic images were processed with CAD.

On physical exam, I palpate no abnormality in the UPPER INNER
QUADRANT of the RIGHT breast.

Targeted ultrasound is performed, showing normal appearing
fibroglandular tissue in the UPPER-OUTER QUADRANT of the RIGHT
breast. No suspicious mass, distortion, or acoustic shadowing is
demonstrated with ultrasound.
IMPRESSION: No mammographic or ultrasound evidence for malignancy.

RECOMMENDATION:
Screening mammogram in one year.(Code:ML-[id number])

I have discussed the findings and recommendations with the patient.
Results were also provided in writing at the conclusion of the
visit. If applicable, a reminder letter will be sent to the patient
regarding the next appointment.

BI-RADS CATEGORY  2: Benign.
